# Patient Record
Sex: Male | Born: 1960 | Race: White | Hispanic: No | Marital: Married | Smoking: Former smoker
Health system: Southern US, Community
[De-identification: ages and names within clinical notes are randomized; demographics above are authoritative.]

## PROBLEM LIST (undated history)

## (undated) DIAGNOSIS — N200 Calculus of kidney: Secondary | ICD-10-CM

## (undated) DIAGNOSIS — M1990 Unspecified osteoarthritis, unspecified site: Secondary | ICD-10-CM

## (undated) DIAGNOSIS — K219 Gastro-esophageal reflux disease without esophagitis: Secondary | ICD-10-CM

## (undated) DIAGNOSIS — M7580 Other shoulder lesions, unspecified shoulder: Secondary | ICD-10-CM

## (undated) DIAGNOSIS — K449 Diaphragmatic hernia without obstruction or gangrene: Secondary | ICD-10-CM

## (undated) DIAGNOSIS — E785 Hyperlipidemia, unspecified: Secondary | ICD-10-CM

## (undated) HISTORY — PX: HERNIA REPAIR: SHX51

## (undated) HISTORY — PX: CERVICAL FUSION: SHX112

---

## 2001-09-05 ENCOUNTER — Emergency Department (HOSPITAL_COMMUNITY): Admission: EM | Admit: 2001-09-05 | Discharge: 2001-09-05 | Payer: Self-pay | Admitting: *Deleted

## 2002-11-13 ENCOUNTER — Emergency Department (HOSPITAL_COMMUNITY): Admission: EM | Admit: 2002-11-13 | Discharge: 2002-11-13 | Payer: Self-pay

## 2010-02-26 ENCOUNTER — Emergency Department (HOSPITAL_BASED_OUTPATIENT_CLINIC_OR_DEPARTMENT_OTHER): Admission: EM | Admit: 2010-02-26 | Discharge: 2010-02-27 | Payer: Self-pay | Admitting: Emergency Medicine

## 2011-05-11 ENCOUNTER — Emergency Department
Admission: EM | Admit: 2011-05-11 | Discharge: 2011-05-11 | Disposition: A | Discharge status: Home / Self Care | Payer: Self-pay | Source: Home / Self Care | Attending: Emergency Medicine | Admitting: Emergency Medicine

## 2011-05-11 ENCOUNTER — Encounter: Payer: Self-pay | Admitting: *Deleted

## 2011-05-11 DIAGNOSIS — M25519 Pain in unspecified shoulder: Secondary | ICD-10-CM

## 2011-05-11 DIAGNOSIS — M25511 Pain in right shoulder: Secondary | ICD-10-CM

## 2011-05-11 HISTORY — DX: Gastro-esophageal reflux disease without esophagitis: K21.9

## 2011-05-11 HISTORY — DX: Unspecified osteoarthritis, unspecified site: M19.90

## 2011-05-11 HISTORY — DX: Hyperlipidemia, unspecified: E78.5

## 2011-05-11 HISTORY — DX: Calculus of kidney: N20.0

## 2011-05-11 HISTORY — DX: Other shoulder lesions, unspecified shoulder: M75.80

## 2011-05-11 HISTORY — DX: Diaphragmatic hernia without obstruction or gangrene: K44.9

## 2011-05-11 MED ORDER — TRAMADOL HCL 50 MG PO TABS: 50.0000 mg | ORAL_TABLET | Freq: Four times a day (QID) | ORAL | Status: AC | PRN

## 2011-05-11 NOTE — ED Provider Notes (Signed)
History     CSN: [id number] Arrival date & time: 05/11/2011 11:35 AM   First MD Initiated Contact with Patient 05/11/11 1210      Chief Complaint  Patient presents with  . Shoulder Pain    Right    (Consider location/radiation/quality/duration/timing/severity/associated sxs/prior treatment) HPI This is a patient who has a history of recurrent right shoulder pain. He is a [employer] architect and states that he frequently has to fight with the clutch on his truck. He feels it does irritate irritating his shoulder. He also use to do shot put when he was younger. He does not recall any specific trauma but has had multiple x-rays in the past which have shown a bone spur and some arthritis in his shoulder. Over the last 2 days the pain has gotten worse, and this correlates with driving the truck that has a rough clutch. He describes it as tightness and sharp pain. He has had a cortisone injection in the past which did help a lot. It has been about 7 years since that last cortisone shot.  Past Medical History  Diagnosis Date  . Arthritis     back & neck  . GERD (gastroesophageal reflux disease)   . Hiatal hernia   . AC (acromioclavicular) joint bone spurs   . Hyperlipemia   . Kidney stones     Past Surgical History  Procedure Date  . Cervical fusion   . Hernia repair     hiatal    Family History  Problem Relation Age of Onset  . Cancer Father   . Hyperlipidemia Father     History  Substance Use Topics  . Smoking status: Unknown If Ever Smoked  . Smokeless tobacco: Not on file  . Alcohol Use: No      Review of Systems  Allergies  Epinephrine  Home Medications   Current Outpatient Rx  Name Route Sig Dispense Refill  . NAPROXEN SODIUM 220 MG PO TABS Oral Take 220 mg by mouth 2 (two) times daily with a meal.      . OMEPRAZOLE 20 MG PO CPDR Oral Take 20 mg by mouth daily.      . TRAMADOL HCL 50 MG PO TABS Oral Take 1 tablet (50 mg total) by mouth every 6 (six) hours as  needed for pain. Maximum dose= 8 tablets per day 24 tablet 0    BP 138/89  Pulse 66  Temp(Src) 98.3 F (36.8 C) (Oral)  Resp 12  Ht 6\' 1"  (1.854 m)  Wt 217 lb 6.4 oz (98.612 kg)  BMI 28.68 kg/m2  SpO2 96%  Physical Exam  Nursing note and vitals reviewed. Constitutional: He is oriented to person, place, and time. He appears well-developed and well-nourished.  HENT:  Head: Normocephalic and atraumatic.  Neck: Neck supple.  Cardiovascular: Regular rhythm and normal heart sounds.   Pulmonary/Chest: Effort normal and breath sounds normal. No respiratory distress.  Musculoskeletal:       R Shoulder: Inspection reveals no abnormalities, atrophy or asymmetry.  Palpation demonstrates tenderness at the lateral aspect of the shoulder, acromion and coracoid area. This radiates also down in to his bicep tendon. No tenderness at the a.c., [place] or clavicle.. No posterior tenderness.  Range of motion is limited secondary to pain. However rotator cuff strength appears normal. + Neer and Hawkin's tests, empty can.   Distal NV status intact.   Neurological: He is alert and oriented to person, place, and time.  Skin: Skin is warm and dry.  Psychiatric: He has a normal mood and affect. His speech is normal.    ED Course  Injection of joint Date/Time: 05/11/2011 1:40 PM Performed by: Orson Aloe, JEFFREY HARRIS Authorized by: Lily Kocher Consent: Verbal consent obtained. Risks and benefits: risks, benefits and alternatives were discussed Consent given by: patient Comments: Consent was obtained. Risks benefits and alternatives were were discussed. The patient decided to go through with the injection. The patient was seated in the area was prepped in sterile fashion by using iodine and an alcohol swab. From the posterior approach, 40 mg of Depo-Medrol +3 cc of lidocaine 1% without epinephrine were injected into the shoulder joint. The patient tolerated this procedure very well. The area was  covered with a Band-Aid.   (including critical care time)  Labs Reviewed - No data to display No results found.   1. Right shoulder pain       MDM   An intra-articular injection of the right shoulder was performed today as described above. I also gave him a prescription for tramadol. Because he has a history of recurrent issue is in the right shoulder, I have suggested that he followup with sports medicine to have this examined. Due to his history, I believe that an MRI would be appropriate to find out the true etiology of his recurrent pain, whether or not this is a rotator cuff tear or even a labral issue. In addition I advised him to do rest, ice, and avoid any kind of lifting pushing or pulling for the next week. I have educated him on side effects of steroid injections including steroid flare and infection.     Lily Kocher, MD 05/11/11 1346

## 2011-05-17 ENCOUNTER — Other Ambulatory Visit: Payer: Self-pay | Admitting: Sports Medicine

## 2011-05-17 ENCOUNTER — Ambulatory Visit
Admission: RE | Admit: 2011-05-17 | Discharge: 2011-05-17 | Disposition: A | Discharge status: Home / Self Care | Payer: Managed Care, Other (non HMO) | Source: Ambulatory Visit | Attending: Sports Medicine | Admitting: Sports Medicine

## 2011-05-17 DIAGNOSIS — M25511 Pain in right shoulder: Secondary | ICD-10-CM

## 2011-05-21 ENCOUNTER — Emergency Department
Admission: EM | Admit: 2011-05-21 | Discharge: 2011-05-21 | Disposition: A | Discharge status: Home / Self Care | Payer: Managed Care, Other (non HMO) | Source: Home / Self Care | Attending: Emergency Medicine | Admitting: Emergency Medicine

## 2011-05-21 DIAGNOSIS — J069 Acute upper respiratory infection, unspecified: Secondary | ICD-10-CM

## 2011-05-21 DIAGNOSIS — J111 Influenza due to unidentified influenza virus with other respiratory manifestations: Secondary | ICD-10-CM

## 2011-05-21 DIAGNOSIS — J029 Acute pharyngitis, unspecified: Secondary | ICD-10-CM

## 2011-05-21 MED ORDER — GUAIFENESIN-CODEINE 100-10 MG/5ML PO SYRP: 5.0000 mL | ORAL_SOLUTION | Freq: Four times a day (QID) | ORAL | Status: AC | PRN

## 2011-05-21 NOTE — ED Provider Notes (Signed)
History     CSN: [id number] Arrival date & time: No admission date for patient encounter.   First MD Initiated Contact with Patient 05/21/11 1752      No chief complaint on file.   (Consider location/radiation/quality/duration/timing/severity/associated sxs/prior treatment) HPI Brian Pham is a 50 y.o. male who complains of onset of cold symptoms for 2 days.  + sore throat + cough No pleuritic pain No wheezing No nasal congestion + post-nasal drainage + sinus pain/pressure + chest congestion No itchy/red eyes No earache No hemoptysis No SOB + chills/sweats + fever No nausea No vomiting No abdominal pain No diarrhea No skin rashes + fatigue + myalgias + headache    Past Medical History  Diagnosis Date  . Arthritis     back & neck  . GERD (gastroesophageal reflux disease)   . Hiatal hernia   . AC (acromioclavicular) joint bone spurs   . Hyperlipemia   . Kidney stones     Past Surgical History  Procedure Date  . Cervical fusion   . Hernia repair     hiatal    Family History  Problem Relation Age of Onset  . Cancer Father   . Hyperlipidemia Father     History  Substance Use Topics  . Smoking status: Unknown If Ever Smoked  . Smokeless tobacco: Not on file  . Alcohol Use: No      Review of Systems  Allergies  Epinephrine  Home Medications   Current Outpatient Rx  Name Route Sig Dispense Refill  . NAPROXEN SODIUM 220 MG PO TABS Oral Take 220 mg by mouth 2 (two) times daily with a meal.      . OMEPRAZOLE 20 MG PO CPDR Oral Take 20 mg by mouth daily.      . TRAMADOL HCL 50 MG PO TABS Oral Take 1 tablet (50 mg total) by mouth every 6 (six) hours as needed for pain. Maximum dose= 8 tablets per day 24 tablet 0    There were no vitals taken for this visit.  Physical Exam  Nursing note and vitals reviewed. Constitutional: He is oriented to person, place, and time. He appears well-developed and well-nourished.  HENT:  Head: Normocephalic and  atraumatic.  Right Ear: Tympanic membrane, external ear and ear canal normal.  Left Ear: Tympanic membrane, external ear and ear canal normal.  Nose: Mucosal edema and rhinorrhea present.  Mouth/Throat: Posterior oropharyngeal erythema present. No oropharyngeal exudate or posterior oropharyngeal edema.  Eyes: No scleral icterus.  Neck: Neck supple.  Cardiovascular: Regular rhythm and normal heart sounds.   Pulmonary/Chest: Effort normal and breath sounds normal. No respiratory distress.  Neurological: He is alert and oriented to person, place, and time.  Skin: Skin is warm and dry.  Psychiatric: He has a normal mood and affect. His speech is normal.    ED Course  Procedures (including critical care time)  Labs Reviewed - No data to display No results found.   No diagnosis found.    MDM  1)  The rapid strep test is negative.  This is likely the flu.  But too long to treat w/ tamiflu.  Will instead just give cough meds and suggest avoidance of transmitting to others.  Work note given. 2)  Use nasal saline solution (over the counter) at least 3 times a day. 3)  Use over the counter decongestants like Zyrtec-D every 12 hours as needed to help with congestion.  If you have hypertension, do not take medicines with sudafed.  4)  Can take tylenol every 6 hours or motrin every 8 hours for pain or fever. 5)  Follow up with your primary doctor if no improvement in 5-7 days, sooner if increasing pain, fever, or new symptoms.     Lily Kocher, MD 05/21/11 [phone]

## 2011-05-21 NOTE — ED Notes (Signed)
2 days with fever and sore throat

## 2012-02-24 ENCOUNTER — Emergency Department (HOSPITAL_BASED_OUTPATIENT_CLINIC_OR_DEPARTMENT_OTHER)
Admission: EM | Admit: 2012-02-24 | Discharge: 2012-02-24 | Disposition: A | Discharge status: Home / Self Care | Payer: Worker's Compensation | Attending: Emergency Medicine | Admitting: Emergency Medicine

## 2012-02-24 ENCOUNTER — Encounter (HOSPITAL_BASED_OUTPATIENT_CLINIC_OR_DEPARTMENT_OTHER): Payer: Self-pay | Admitting: Emergency Medicine

## 2012-02-24 DIAGNOSIS — Z87891 Personal history of nicotine dependence: Secondary | ICD-10-CM | POA: Insufficient documentation

## 2012-02-24 DIAGNOSIS — K219 Gastro-esophageal reflux disease without esophagitis: Secondary | ICD-10-CM | POA: Insufficient documentation

## 2012-02-24 DIAGNOSIS — M5412 Radiculopathy, cervical region: Secondary | ICD-10-CM | POA: Insufficient documentation

## 2012-02-24 DIAGNOSIS — M129 Arthropathy, unspecified: Secondary | ICD-10-CM | POA: Insufficient documentation

## 2012-02-24 DIAGNOSIS — E785 Hyperlipidemia, unspecified: Secondary | ICD-10-CM | POA: Insufficient documentation

## 2012-02-24 MED ORDER — IBUPROFEN 800 MG PO TABS
800.0000 mg | ORAL_TABLET | Freq: Once | ORAL | Status: AC
  Administered 2012-02-24: 800 mg via ORAL
  Filled 2012-02-24: qty 1

## 2012-02-24 MED ORDER — CYCLOBENZAPRINE HCL 10 MG PO TABS: 10.0000 mg | ORAL_TABLET | Freq: Two times a day (BID) | ORAL | Status: DC | PRN

## 2012-02-24 MED ORDER — HYDROCODONE-ACETAMINOPHEN 5-325 MG PO TABS: 1.0000 | ORAL_TABLET | Freq: Four times a day (QID) | ORAL | Status: AC | PRN

## 2012-02-24 MED ORDER — IBUPROFEN 800 MG PO TABS: 800.0000 mg | ORAL_TABLET | Freq: Three times a day (TID) | ORAL | Status: DC | PRN

## 2012-02-24 NOTE — ED Notes (Addendum)
Pt was at work today when a large pallate fell and pt tried to stop it from falling.  Pt felt a pinch in left shoulder at the time and has had increased back, neck, and left shoulder pain since.  Pt having difficulty turning head from side to side and up and down.  Pt has had spinal fusion in the past.

## 2012-02-24 NOTE — ED Notes (Signed)
MD at bedside. 

## 2012-02-24 NOTE — ED Provider Notes (Signed)
History     CSN: [id number]  Arrival date & time 02/24/12  0147   None     Chief Complaint  Patient presents with  . Neck Pain    (Consider location/radiation/quality/duration/timing/severity/associated sxs/prior treatment) HPI Is a 51 year old white male with history of cervical fusion. 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 He was not struck by a pallet. While moving a fall and pallets he felt a sudden sharp pain in his left neck. That pain radiates to his left upper extremity and about the C6 dermatome. He was having some paresthesias in the first 3 fingers of the left hand earlier although that is improved. He denies loss of strength in the left upper extremity although there is decreased range of motion of left shoulder due to pain. The pain is worsened with rotation of his neck to the left and with movement of the left shoulder. The pain is moderate to severe at its worst. It is tolerable at rest.  Past Medical History  Diagnosis Date  . Arthritis     back & neck  . GERD (gastroesophageal reflux disease)   . Hiatal hernia   . AC (acromioclavicular) joint bone spurs   . Hyperlipemia   . Kidney stones     Past Surgical History  Procedure Date  . Cervical fusion   . Hernia repair     hiatal    Family History  Problem Relation Age of Onset  . Cancer Father   . Hyperlipidemia Father     History  Substance Use Topics  . Smoking status: Former Smoker    Types: Cigarettes    Quit date: 02/24/1984  . Smokeless tobacco: Not on file  . Alcohol Use: No      Review of Systems  All other systems reviewed and are negative.    Allergies  Epinephrine  Home Medications   Current Outpatient Rx  Name Route Sig Dispense Refill  . OMEPRAZOLE 20 MG PO CPDR Oral Take 20 mg by mouth daily.      Marland Kitchen NAPROXEN SODIUM 220 MG PO TABS Oral Take 220 mg by mouth 2 (two) times daily with a meal.        BP 138/83  Pulse 99   Temp 98 F (36.7 C) (Oral)  Resp 16  Ht 6\' 1"  (1.854 m)  Wt 210 lb (95.255 kg)  BMI 27.71 kg/m2  SpO2 98%  Physical Exam General: Well-developed, well-nourished male in no acute distress; appearance consistent with age of record HENT: normocephalic, atraumatic Eyes: pupils equal round and reactive to light; extraocular muscles intact Neck: supple; pain on rotation to left Heart: regular rate and rhythm Lungs: clear to auscultation bilaterally Abdomen: soft; nondistended Extremities: No deformity; decreased range of motion of left shoulder due to pain Neurologic: Awake, alert and oriented; motor function intact in all extremities and symmetric; no facial droop Skin: Warm and dry Psychiatric: Normal mood and affect    ED Course  Procedures (including critical care time)     MDM  History and exam consistent with C6 radiculopathy. Patient has a primary care physician with whom he can follow up.        Hanley Seamen, MD 02/24/12 [phone]

## 2014-04-07 ENCOUNTER — Emergency Department (HOSPITAL_BASED_OUTPATIENT_CLINIC_OR_DEPARTMENT_OTHER)
Admission: EM | Admit: 2014-04-07 | Discharge: 2014-04-08 | Disposition: A | Discharge status: Home / Self Care | Payer: Worker's Compensation | Attending: Emergency Medicine | Admitting: Emergency Medicine

## 2014-04-07 ENCOUNTER — Encounter (HOSPITAL_BASED_OUTPATIENT_CLINIC_OR_DEPARTMENT_OTHER): Payer: Self-pay | Admitting: *Deleted

## 2014-04-07 ENCOUNTER — Emergency Department (HOSPITAL_BASED_OUTPATIENT_CLINIC_OR_DEPARTMENT_OTHER): Payer: Worker's Compensation

## 2014-04-07 DIAGNOSIS — Y9389 Activity, other specified: Secondary | ICD-10-CM | POA: Insufficient documentation

## 2014-04-07 DIAGNOSIS — Z791 Long term (current) use of non-steroidal anti-inflammatories (NSAID): Secondary | ICD-10-CM | POA: Insufficient documentation

## 2014-04-07 DIAGNOSIS — S93401A Sprain of unspecified ligament of right ankle, initial encounter: Secondary | ICD-10-CM | POA: Insufficient documentation

## 2014-04-07 DIAGNOSIS — W19XXXA Unspecified fall, initial encounter: Secondary | ICD-10-CM

## 2014-04-07 DIAGNOSIS — Z8739 Personal history of other diseases of the musculoskeletal system and connective tissue: Secondary | ICD-10-CM | POA: Diagnosis not present

## 2014-04-07 DIAGNOSIS — W1830XA Fall on same level, unspecified, initial encounter: Secondary | ICD-10-CM | POA: Insufficient documentation

## 2014-04-07 DIAGNOSIS — Z8719 Personal history of other diseases of the digestive system: Secondary | ICD-10-CM | POA: Diagnosis not present

## 2014-04-07 DIAGNOSIS — Z87442 Personal history of urinary calculi: Secondary | ICD-10-CM | POA: Diagnosis not present

## 2014-04-07 DIAGNOSIS — Z79899 Other long term (current) drug therapy: Secondary | ICD-10-CM | POA: Diagnosis not present

## 2014-04-07 DIAGNOSIS — Z8639 Personal history of other endocrine, nutritional and metabolic disease: Secondary | ICD-10-CM | POA: Insufficient documentation

## 2014-04-07 DIAGNOSIS — Y9289 Other specified places as the place of occurrence of the external cause: Secondary | ICD-10-CM | POA: Insufficient documentation

## 2014-04-07 DIAGNOSIS — S99921A Unspecified injury of right foot, initial encounter: Secondary | ICD-10-CM | POA: Diagnosis present

## 2014-04-07 DIAGNOSIS — Z87891 Personal history of nicotine dependence: Secondary | ICD-10-CM | POA: Diagnosis not present

## 2014-04-07 DIAGNOSIS — R52 Pain, unspecified: Secondary | ICD-10-CM

## 2014-04-07 NOTE — ED Notes (Signed)
Pt c/o fall with left foot injury  X 2 hrs ago

## 2014-04-08 ENCOUNTER — Emergency Department (HOSPITAL_BASED_OUTPATIENT_CLINIC_OR_DEPARTMENT_OTHER): Payer: Worker's Compensation

## 2014-04-08 MED ORDER — HYDROCODONE-ACETAMINOPHEN 5-325 MG PO TABS: 1.0000 | ORAL_TABLET | ORAL | Status: DC | PRN

## 2014-04-08 MED ORDER — IBUPROFEN 800 MG PO TABS
800.0000 mg | ORAL_TABLET | Freq: Once | ORAL | Status: AC
  Administered 2014-04-08: 800 mg via ORAL
  Filled 2014-04-08: qty 1

## 2014-04-08 NOTE — ED Provider Notes (Signed)
CSN: [id number]     Arrival date & time 04/07/14  2328 History   First MD Initiated Contact with Patient 04/07/14 2337     Chief Complaint  Patient presents with  . Foot Injury     (Consider location/radiation/quality/duration/timing/severity/associated sxs/prior Treatment) Patient is a 53 y.o. male presenting with foot injury. The history is provided by the patient.  Foot Injury Location:  Ankle Time since incident:  2 hours Injury: yes   Mechanism of injury comment:  Stepped in a pot hole and rolled his ankle Ankle location:  R ankle Pain details:    Quality:  Aching, sharp and throbbing   Radiates to:  R leg   Severity:  Moderate   Onset quality:  Gradual   Duration:  2 hours   Timing:  Constant   Progression:  Worsening Chronicity:  New Dislocation: no   Prior injury to area:  No Relieved by:  Rest Worsened by:  Bearing weight and flexion Ineffective treatments:  None tried Associated symptoms: decreased ROM, numbness and swelling     Past Medical History  Diagnosis Date  . Arthritis     back & neck  . GERD (gastroesophageal reflux disease)   . Hiatal hernia   . AC (acromioclavicular) joint bone spurs   . Hyperlipemia   . Kidney stones    Past Surgical History  Procedure Laterality Date  . Cervical fusion    . Hernia repair      hiatal   Family History  Problem Relation Age of Onset  . Cancer Father   . Hyperlipidemia Father    History  Substance Use Topics  . Smoking status: Former Smoker    Types: Cigarettes    Quit date: 02/24/1984  . Smokeless tobacco: Not on file  . Alcohol Use: No    Review of Systems  All other systems reviewed and are negative.     Allergies  Epinephrine  Home Medications   Prior to Admission medications   Medication Sig Start Date End Date Taking? Authorizing Provider  cyclobenzaprine (FLEXERIL) 10 MG tablet Take 1 tablet (10 mg total) by mouth 2 (two) times daily as needed for muscle spasms. 02/24/12   John L  Molpus, MD  ibuprofen (ADVIL,MOTRIN) 800 MG tablet Take 1 tablet (800 mg total) by mouth every 8 (eight) hours as needed for pain. 02/24/12   Carlisle BeersJohn L Molpus, MD  naproxen sodium (ANAPROX) 220 MG tablet Take 220 mg by mouth 2 (two) times daily with a meal.      Historical Provider, MD  omeprazole (PRILOSEC) 20 MG capsule Take 20 mg by mouth daily.      Historical Provider, MD   BP 156/100 mmHg  Pulse 102  Temp(Src) 97.7 F (36.5 C) (Oral)  Resp 16  Ht 6\' 1"  (1.854 m)  Wt 224 lb (101.606 kg)  BMI 29.56 kg/m2  SpO2 100% Physical Exam  Constitutional: He is oriented to person, place, and time. He appears well-developed and well-nourished. No distress.  HENT:  Head: Normocephalic and atraumatic.  Cardiovascular: Normal rate.   Pulmonary/Chest: Effort normal.  Musculoskeletal:       Right ankle: He exhibits decreased range of motion, swelling and ecchymosis. He exhibits no deformity and normal pulse. Tenderness. Lateral malleolus and proximal fibula tenderness found. No head of 5th metatarsal tenderness found. Achilles tendon exhibits no pain.  Neurological: He is alert and oriented to person, place, and time.  Skin: Skin is warm and dry. No rash noted. No erythema.  Psychiatric: He has a normal mood and affect. His behavior is normal.  Nursing note and vitals reviewed.   ED Course  Procedures (including critical care time) Labs Review Labs Reviewed - No data to display  Imaging Review Dg Tibia/fibula Right  04/08/2014   CLINICAL DATA:  Right lower leg pain after a fall.  EXAM: RIGHT TIBIA AND FIBULA - 2 VIEW  COMPARISON:  None.  FINDINGS: There is no evidence of fracture or other focal bone lesions. Soft tissues are unremarkable.  IMPRESSION: Negative.   Electronically Signed   By: Burman NievesWilliam  Stevens M.D.   On: 04/08/2014 01:51   Dg Ankle Complete Right  04/08/2014   CLINICAL DATA:  Larey SeatFell this morning with twisting injury.  Pain.  EXAM: RIGHT ANKLE - COMPLETE 3+ VIEW  COMPARISON:  None.   FINDINGS: There is no evidence of fracture, dislocation, or joint effusion. There is no evidence of arthropathy or other focal bone abnormality. Soft tissues are unremarkable.  IMPRESSION: Negative.   Electronically Signed   By: Burman NievesWilliam  Stevens M.D.   On: 04/08/2014 00:47   Dg Foot Complete Right  04/08/2014   CLINICAL DATA:  Fall this morning. Twisting injury to the right foot and ankle. Pain bilaterally.  EXAM: RIGHT FOOT COMPLETE - 3+ VIEW  COMPARISON:  None.  FINDINGS: Benign-appearing sclerosis in the first distal phalanx. Small Achilles calcaneal spur. Old ununited ossicle adjacent to the navicular bone. No evidence of acute fracture or subluxation. No focal bone lesion or bone destruction. Bone cortex and trabecular architecture appear intact. No radiopaque soft tissue foreign bodies.  IMPRESSION: No acute bony abnormalities.   Electronically Signed   By: Burman NievesWilliam  Stevens M.D.   On: 04/08/2014 00:47     EKG Interpretation None      MDM   Final diagnoses:  Pain  Right ankle sprain, initial encounter    Patient fell at work when he stepped in a pothole. He has evidence of right lateral malleoli or swelling with negative x-rays consistent with ankle sprain. Also patient has fibular head tenderness x-rays pending.  2:36 AM All imaging neg.  Pt placed in air cast and crutches and given f/u.  Gwyneth SproutWhitney Jeury Mcnab, MD 04/08/14 [phone]

## 2014-04-08 NOTE — ED Notes (Signed)
Patient transported to X-ray 

## 2014-04-08 NOTE — ED Notes (Signed)
Stepped in hole and rolled rt ankle  Slight swelling

## 2014-04-13 ENCOUNTER — Encounter: Payer: Self-pay | Admitting: Family Medicine

## 2014-04-13 ENCOUNTER — Ambulatory Visit (INDEPENDENT_AMBULATORY_CARE_PROVIDER_SITE_OTHER): Payer: Worker's Compensation | Admitting: Family Medicine

## 2014-04-13 ENCOUNTER — Telehealth (HOSPITAL_BASED_OUTPATIENT_CLINIC_OR_DEPARTMENT_OTHER): Payer: Self-pay | Admitting: *Deleted

## 2014-04-13 VITALS — BP 167/94 | HR 103 | Ht 73.0 in | Wt 224.0 lb

## 2014-04-13 DIAGNOSIS — S99921A Unspecified injury of right foot, initial encounter: Secondary | ICD-10-CM

## 2014-04-13 MED ORDER — HYDROCODONE-ACETAMINOPHEN 5-325 MG PO TABS: 1.0000 | ORAL_TABLET | Freq: Four times a day (QID) | ORAL | Status: AC | PRN

## 2014-04-13 NOTE — Patient Instructions (Signed)
You have a cuboid fracture. Wear boot when up and walking around - ok to take off if not weight bearing. Crutches to help with walking but you can put weight on this as tolerated in the boot. Icing 15 minutes at a time 3-4 times a day. Ibuprofen or aleve as needed for pain and inflammation. Norco as needed for severe pain. These take about 6-8 weeks to heal. Follow up with me in 2 weeks for reevaluation.

## 2014-04-15 DIAGNOSIS — S99921A Unspecified injury of right foot, initial encounter: Secondary | ICD-10-CM | POA: Insufficient documentation

## 2014-04-15 NOTE — Progress Notes (Signed)
PCP: No primary care provider on file.  Subjective:   HPI: Patient is a 53 y.o. male here for right foot injury.  Patient reports on 11/3 he stepped in a pothole and inverted his right ankle. Couldn't bear weight after this. Has been icing, resting. Radiographs read as negative for acute fracture. He drives a semi for work. Tried to drive Saturday but pain on lateral foot severe, sharp. + swelling that has persisted in this area. Rarely taking norco. No prior injuries. Now using an aircast.  Past Medical History  Diagnosis Date  . Arthritis     back & neck  . GERD (gastroesophageal reflux disease)   . Hiatal hernia   . AC (acromioclavicular) joint bone spurs   . Hyperlipemia   . Kidney stones     No current outpatient prescriptions on file prior to visit.   No current facility-administered medications on file prior to visit.    Past Surgical History  Procedure Laterality Date  . Cervical fusion    . Hernia repair      hiatal    Allergies  Allergen Reactions  . Epinephrine     States "almost killed me, told me never to take it again"    History   Social History  . Marital Status: Married    Spouse Name: N/A    Number of Children: N/A  . Years of Education: N/A   Occupational History  . Not on file.   Social History Main Topics  . Smoking status: Former Smoker    Types: Cigarettes    Quit date: 02/24/1984  . Smokeless tobacco: Not on file  . Alcohol Use: No  . Drug Use: No  . Sexual Activity: Not on file   Other Topics Concern  . Not on file   Social History Narrative    Family History  Problem Relation Age of Onset  . Cancer Father   . Hyperlipidemia Father     BP 167/94 mmHg  Pulse 103  Ht 6\' 1"  (1.854 m)  Wt 224 lb (101.606 kg)  BMI 29.56 kg/m2  Review of Systems: See HPI above.    Objective:  Physical Exam:  Gen: NAD  Right foot/ankle: Mod swelling dorsolateral foot.  No bruising, other deformity. Mild limitation with  external and internal rotation.   TTP over base 5th metatarsal, cuboid Negative ant drawer and talar tilt.   Negative syndesmotic compression. Thompsons test negative. NV intact distally.    MSK u/s:  Peroneus brevis tendon intact.  Small avulsion fragment visualized next to cuboid.  5th metatarsal appears normal.   Assessment & Plan:  1. Right foot injury - based on his exam, radiographs, and ultrasound I believe he sustained a cuboid fracture and a fragment can me seen adjacent to this.  Peroneus brevis and 5th metatarsal appear normal.  Should heal well with conservative treatment however as most cuboid fractures do.  Weight bearing as tolerated in cam walker.  Crutches as needed.  Icing, nsaids with norco as needed.  F/u in 2 weeks.  Expect 6-8 weeks for complete healing.

## 2014-04-15 NOTE — Assessment & Plan Note (Signed)
based on his exam, radiographs, and ultrasound I believe he sustained a cuboid fracture and a fragment can me seen adjacent to this.  Peroneus brevis and 5th metatarsal appear normal.  Should heal well with conservative treatment however as most cuboid fractures do.  Weight bearing as tolerated in cam walker.  Crutches as needed.  Icing, nsaids with norco as needed.  F/u in 2 weeks.  Expect 6-8 weeks for complete healing.

## 2014-04-27 ENCOUNTER — Ambulatory Visit: Payer: Self-pay | Admitting: Family Medicine

## 2016-06-15 IMAGING — CR DG TIBIA/FIBULA 2V*R*
4 series · 4 of 4 positions shown · non-contrast
Comparison: None.

CLINICAL DATA: Right lower leg pain after a fall.

EXAM:
RIGHT TIBIA AND FIBULA - 2 VIEW

[t tib/fib ap right (1 of 2)]
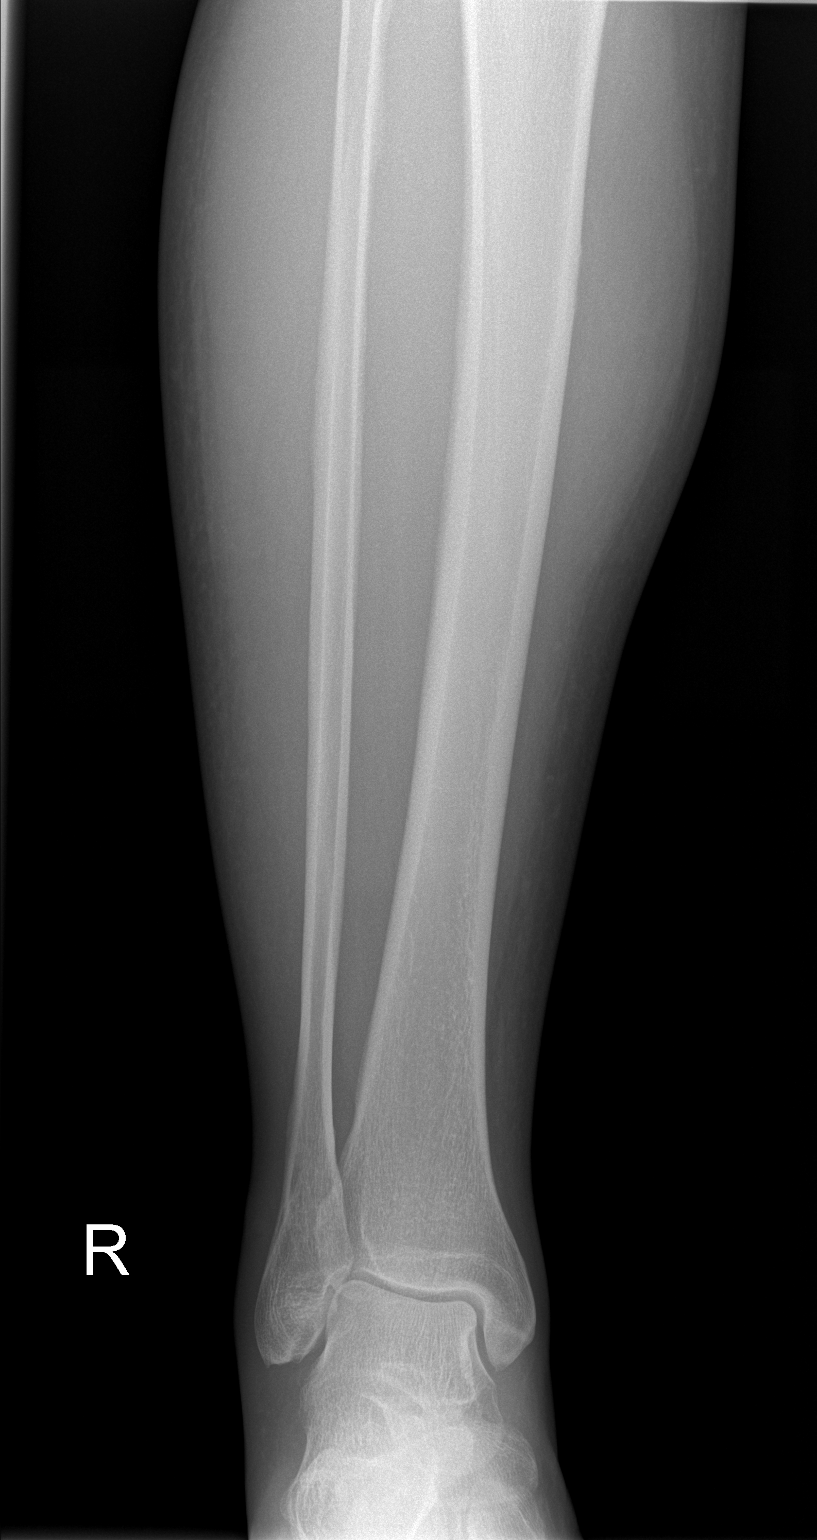

[t tib/fib ap right (2 of 2)]
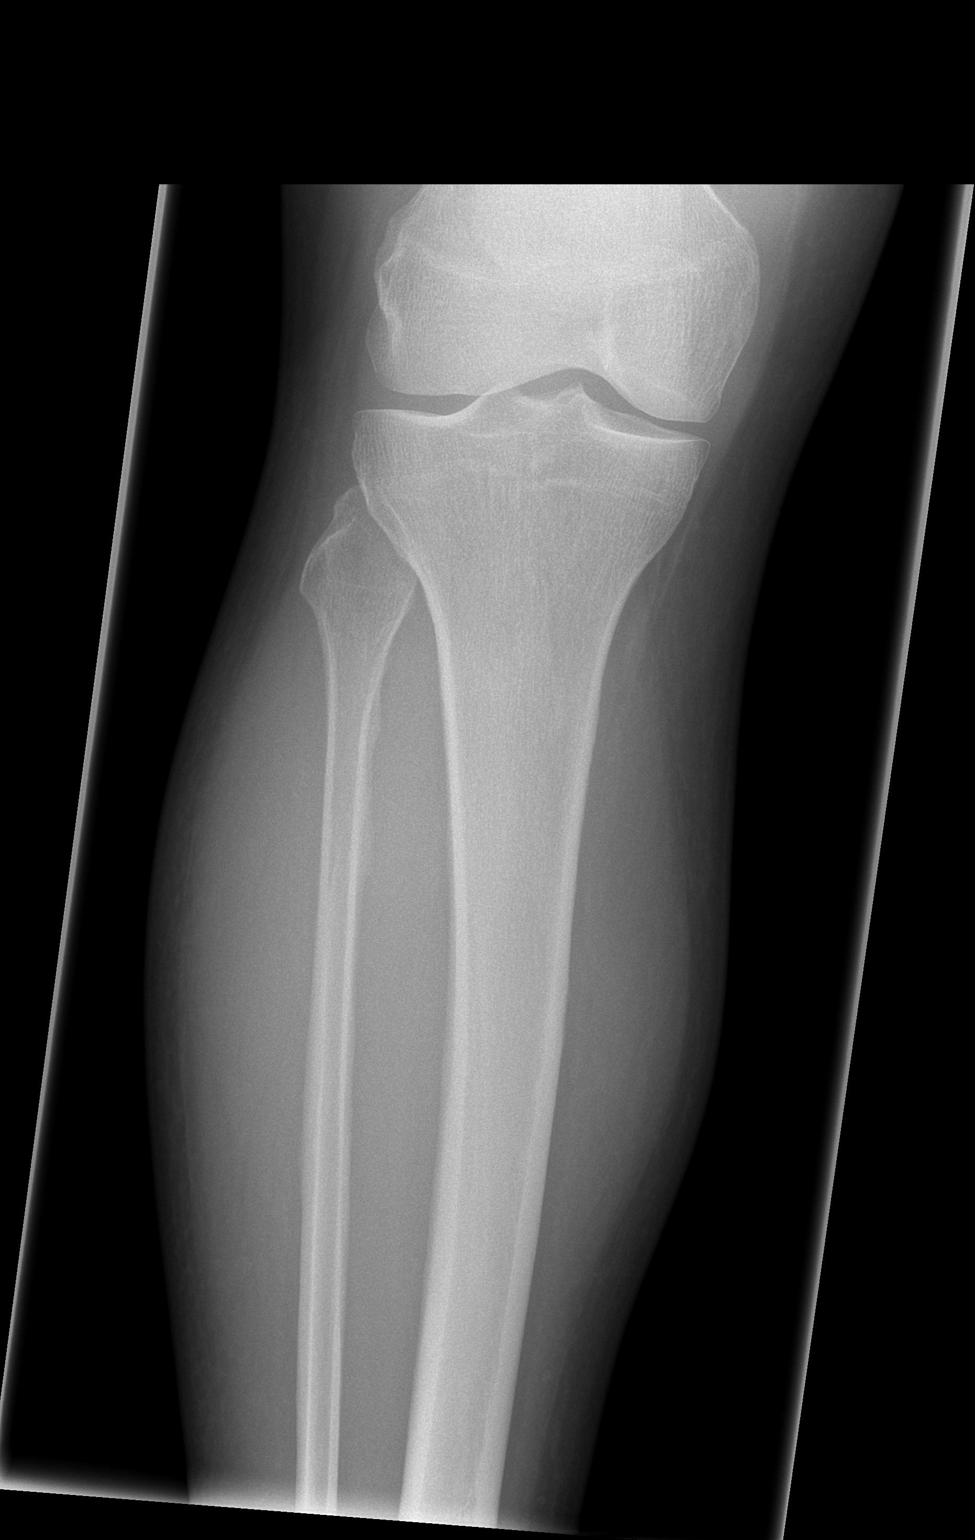

[t tib/fib lat right (1 of 2)]
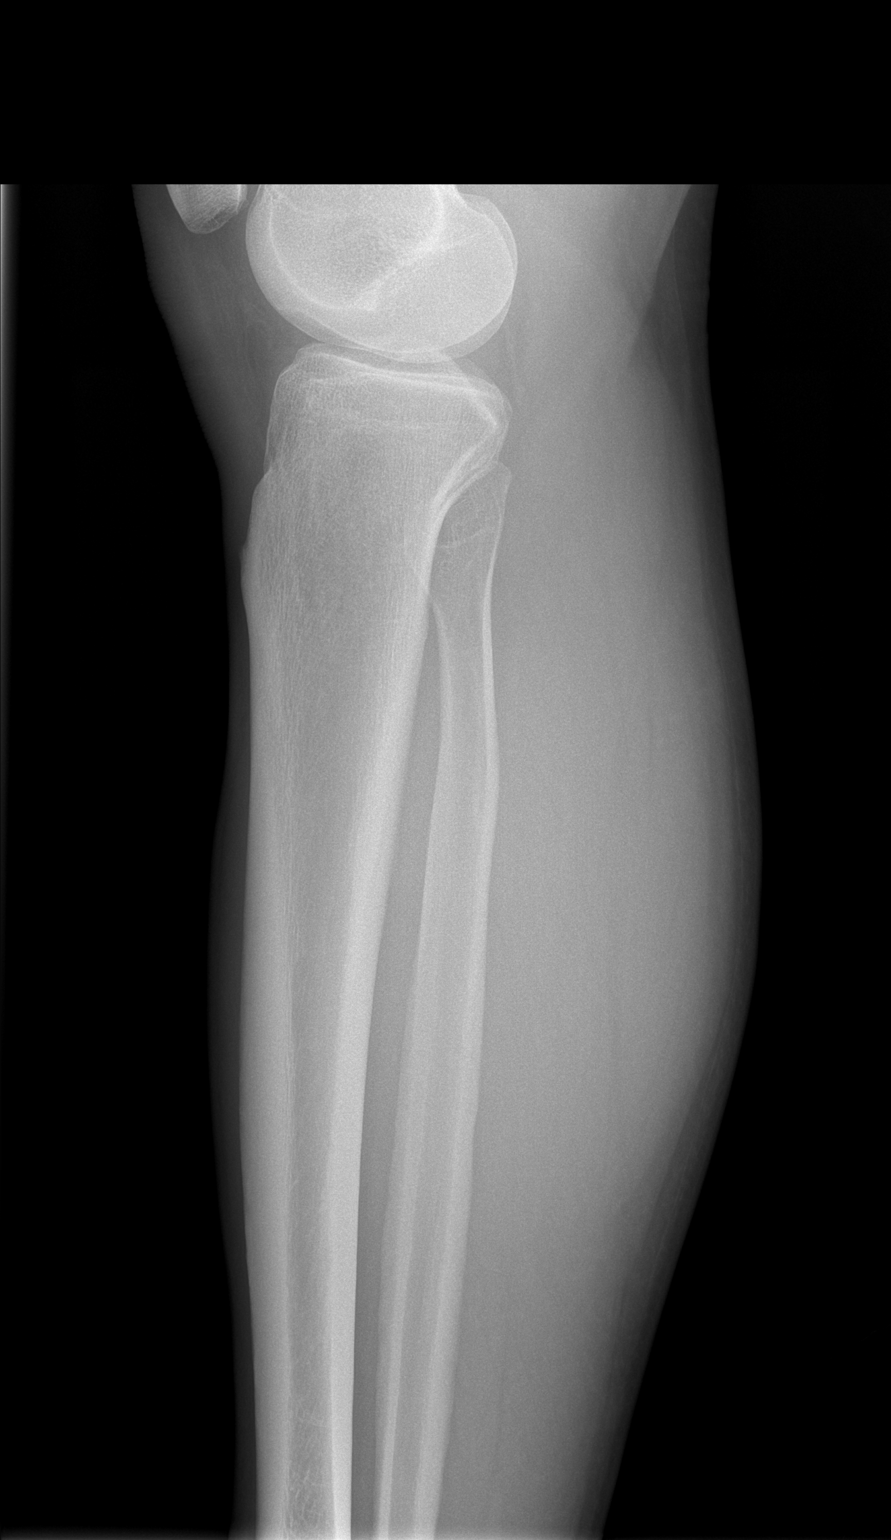

[t tib/fib lat right (2 of 2)]
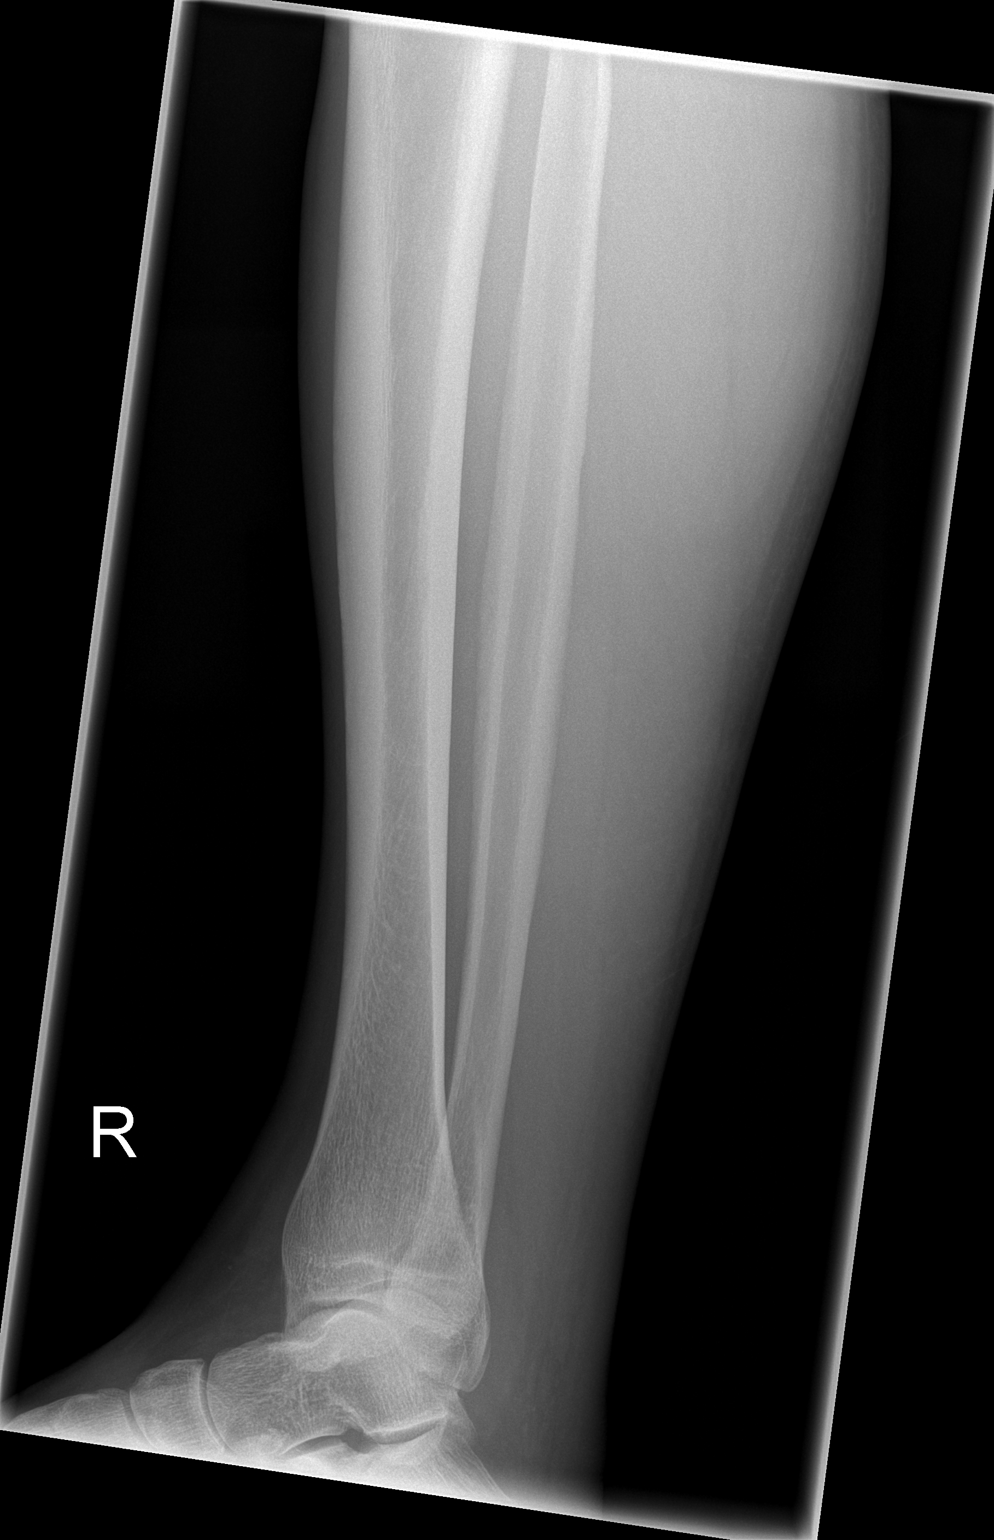

[4 of 4 positions shown; findings below may reference images not displayed]

FINDINGS: There is no evidence of fracture or other focal bone lesions. Soft
tissues are unremarkable.
IMPRESSION: Negative.

## 2023-10-22 LAB — COLOGUARD: COLOGUARD: NEGATIVE
